# Patient Record
Sex: Female | Born: 1980 | Race: Black or African American | Hispanic: No | Marital: Single | Smoking: Current every day smoker
Health system: Southern US, Community
[De-identification: ages and names within clinical notes are randomized; demographics above are authoritative.]

---

## 2004-08-28 ENCOUNTER — Emergency Department: Payer: Self-pay | Admitting: General Practice

## 2005-08-01 ENCOUNTER — Emergency Department: Payer: Self-pay | Admitting: Emergency Medicine

## 2006-10-08 ENCOUNTER — Observation Stay: Payer: Self-pay | Admitting: Obstetrics and Gynecology

## 2007-01-04 ENCOUNTER — Inpatient Hospital Stay: Payer: Self-pay | Admitting: Obstetrics and Gynecology

## 2009-03-13 ENCOUNTER — Emergency Department: Payer: Self-pay | Admitting: Internal Medicine

## 2009-08-26 ENCOUNTER — Emergency Department: Payer: Self-pay | Admitting: Emergency Medicine

## 2010-07-23 ENCOUNTER — Emergency Department: Payer: Self-pay | Admitting: Emergency Medicine

## 2011-02-26 ENCOUNTER — Inpatient Hospital Stay (INDEPENDENT_AMBULATORY_CARE_PROVIDER_SITE_OTHER)
Admission: RE | Admit: 2011-02-26 | Discharge: 2011-02-26 | Disposition: A | Discharge status: Home / Self Care | Payer: PRIVATE HEALTH INSURANCE | Source: Ambulatory Visit | Attending: Family Medicine | Admitting: Family Medicine

## 2011-02-26 ENCOUNTER — Ambulatory Visit (INDEPENDENT_AMBULATORY_CARE_PROVIDER_SITE_OTHER): Payer: Self-pay

## 2011-02-26 DIAGNOSIS — S9030XA Contusion of unspecified foot, initial encounter: Secondary | ICD-10-CM

## 2011-08-13 ENCOUNTER — Other Ambulatory Visit: Payer: Self-pay | Admitting: Occupational Medicine

## 2011-08-13 ENCOUNTER — Ambulatory Visit: Payer: Self-pay

## 2011-08-13 DIAGNOSIS — R52 Pain, unspecified: Secondary | ICD-10-CM

## 2011-09-29 ENCOUNTER — Emergency Department: Payer: Self-pay | Admitting: Emergency Medicine

## 2013-11-11 ENCOUNTER — Emergency Department: Payer: Self-pay | Admitting: Emergency Medicine

## 2014-02-13 ENCOUNTER — Emergency Department: Payer: Self-pay | Admitting: Emergency Medicine

## 2014-02-13 LAB — URINALYSIS, COMPLETE
Bilirubin,UR: NEGATIVE
Glucose,UR: NEGATIVE mg/dL (ref 0–75)
KETONE: NEGATIVE
Nitrite: POSITIVE
Ph: 5 (ref 4.5–8.0)
SPECIFIC GRAVITY: 1.012 (ref 1.003–1.030)
WBC UR: 512 /HPF (ref 0–5)

## 2014-02-13 LAB — CBC WITH DIFFERENTIAL/PLATELET
Basophil #: 0 10*3/uL (ref 0.0–0.1)
Basophil %: 0.2 %
EOS ABS: 0 10*3/uL (ref 0.0–0.7)
Eosinophil %: 0 %
HCT: 38.8 % (ref 35.0–47.0)
HGB: 12.6 g/dL (ref 12.0–16.0)
LYMPHS PCT: 4.5 %
Lymphocyte #: 0.5 10*3/uL — ABNORMAL LOW (ref 1.0–3.6)
MCH: 30 pg (ref 26.0–34.0)
MCHC: 32.6 g/dL (ref 32.0–36.0)
MCV: 92 fL (ref 80–100)
Monocyte #: 0.9 x10 3/mm (ref 0.2–0.9)
Monocyte %: 7.8 %
NEUTROS ABS: 9.8 10*3/uL — AB (ref 1.4–6.5)
Neutrophil %: 87.5 %
Platelet: 224 10*3/uL (ref 150–440)
RBC: 4.21 10*6/uL (ref 3.80–5.20)
RDW: 12.8 % (ref 11.5–14.5)
WBC: 11.3 10*3/uL — ABNORMAL HIGH (ref 3.6–11.0)

## 2014-02-13 LAB — COMPREHENSIVE METABOLIC PANEL
ANION GAP: 4 — AB (ref 7–16)
Albumin: 3.6 g/dL (ref 3.4–5.0)
Alkaline Phosphatase: 108 U/L
BUN: 10 mg/dL (ref 7–18)
Bilirubin,Total: 0.6 mg/dL (ref 0.2–1.0)
CO2: 28 mmol/L (ref 21–32)
CREATININE: 1.17 mg/dL (ref 0.60–1.30)
Calcium, Total: 9.4 mg/dL (ref 8.5–10.1)
Chloride: 104 mmol/L (ref 98–107)
EGFR (African American): >60
EGFR (Non-African Amer.): >60
GLUCOSE: 112 mg/dL — AB (ref 65–99)
OSMOLALITY: 272 (ref 275–301)
POTASSIUM: 4 mmol/L (ref 3.5–5.1)
SGOT(AST): 195 U/L — ABNORMAL HIGH (ref 15–37)
SGPT (ALT): 142 U/L — ABNORMAL HIGH (ref 12–78)
Sodium: 136 mmol/L (ref 136–145)
Total Protein: 8.1 g/dL (ref 6.4–8.2)

## 2014-02-13 LAB — PREGNANCY, URINE: Pregnancy Test, Urine: NEGATIVE m[IU]/mL

## 2014-02-14 LAB — WET PREP, GENITAL

## 2015-01-24 ENCOUNTER — Emergency Department: Payer: Self-pay | Admitting: Student

## 2016-01-29 ENCOUNTER — Emergency Department: Payer: Managed Care, Other (non HMO)

## 2016-01-29 ENCOUNTER — Emergency Department
Admission: EM | Admit: 2016-01-29 | Discharge: 2016-01-29 | Disposition: A | Discharge status: Home / Self Care | Payer: Managed Care, Other (non HMO) | Attending: Emergency Medicine | Admitting: Emergency Medicine

## 2016-01-29 DIAGNOSIS — Y9289 Other specified places as the place of occurrence of the external cause: Secondary | ICD-10-CM | POA: Diagnosis not present

## 2016-01-29 DIAGNOSIS — Y998 Other external cause status: Secondary | ICD-10-CM | POA: Insufficient documentation

## 2016-01-29 DIAGNOSIS — F17210 Nicotine dependence, cigarettes, uncomplicated: Secondary | ICD-10-CM | POA: Insufficient documentation

## 2016-01-29 DIAGNOSIS — X58XXXA Exposure to other specified factors, initial encounter: Secondary | ICD-10-CM | POA: Insufficient documentation

## 2016-01-29 DIAGNOSIS — Y9389 Activity, other specified: Secondary | ICD-10-CM | POA: Diagnosis not present

## 2016-01-29 DIAGNOSIS — S43004A Unspecified dislocation of right shoulder joint, initial encounter: Secondary | ICD-10-CM | POA: Insufficient documentation

## 2016-01-29 DIAGNOSIS — S0993XA Unspecified injury of face, initial encounter: Secondary | ICD-10-CM | POA: Insufficient documentation

## 2016-01-29 DIAGNOSIS — S4991XA Unspecified injury of right shoulder and upper arm, initial encounter: Secondary | ICD-10-CM | POA: Diagnosis present

## 2016-01-29 DIAGNOSIS — R51 Headache: Secondary | ICD-10-CM | POA: Insufficient documentation

## 2016-01-29 MED ORDER — MORPHINE SULFATE (PF) 2 MG/ML IV SOLN
2.0000 mg | Freq: Once | INTRAVENOUS | Status: AC
Start: 2016-01-29 — End: 2016-01-29
  Administered 2016-01-29: 2 mg via INTRAVENOUS

## 2016-01-29 MED ORDER — ONDANSETRON HCL 4 MG/2ML IJ SOLN
INTRAMUSCULAR | Status: AC
  Filled 2016-01-29: qty 2

## 2016-01-29 MED ORDER — KETAMINE HCL 10 MG/ML IJ SOLN
1.0000 mg/kg | Freq: Once | INTRAMUSCULAR | Status: AC
  Administered 2016-01-29: 80 mg via INTRAVENOUS

## 2016-01-29 MED ORDER — LIDOCAINE HCL (PF) 1 % IJ SOLN
INTRAMUSCULAR | Status: AC
  Filled 2016-01-29: qty 10

## 2016-01-29 MED ORDER — ONDANSETRON HCL 4 MG/2ML IJ SOLN
4.0000 mg | Freq: Once | INTRAMUSCULAR | Status: AC
  Administered 2016-01-29: 4 mg via INTRAVENOUS

## 2016-01-29 MED ORDER — HYDROCODONE-ACETAMINOPHEN 5-325 MG PO TABS: 1.0000 | ORAL_TABLET | ORAL | Status: AC | PRN

## 2016-01-29 MED ORDER — KETAMINE HCL 10 MG/ML IJ SOLN
INTRAMUSCULAR | Status: AC
  Filled 2016-01-29: qty 1

## 2016-01-29 MED ORDER — MORPHINE SULFATE (PF) 2 MG/ML IV SOLN
INTRAVENOUS | Status: AC
  Administered 2016-01-29: 2 mg via INTRAVENOUS
  Filled 2016-01-29: qty 1

## 2016-01-29 MED ORDER — ONDANSETRON HCL 4 MG/2ML IJ SOLN
INTRAMUSCULAR | Status: AC
Start: 2016-01-29 — End: 2016-01-29
  Administered 2016-01-29: 4 mg via INTRAVENOUS
  Filled 2016-01-29: qty 2

## 2016-01-29 NOTE — ED Notes (Signed)
Pt back from CT more alert but remains drowsy. Talking and asking questions  Vitals stable.

## 2016-01-29 NOTE — ED Notes (Signed)
Pt verbalized understanding of discharge instructions. NAD at this time. 
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
## 2016-01-29 NOTE — Sedation Documentation (Signed)
Report to denia, rn. Care transferred to denia, rn.
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
## 2016-01-29 NOTE — ED Notes (Signed)
Pt. Up to bathroom. Continues to try and get someone on the phone to pick her up. Nurse goes over discharge instructions with pt.
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
## 2016-01-29 NOTE — ED Notes (Signed)
Pt remains drowsy. Vitals stable. Easy to arouse. Informed of CT scan.

## 2016-01-29 NOTE — Discharge Instructions (Signed)
Please seek medical attention for any high fevers, chest pain, shortness of breath, change in behavior, persistent vomiting, bloody stool or any other new or concerning symptoms. ° ° °Shoulder Dislocation °Your shoulder joint is made up of 3 bones: °· The upper arm bone (humerus). °· The shoulder blade (scapula). °· The collarbone (clavicle). °A shoulder dislocation happens when your upper arm bone moves out of its normal place in your shoulder joint. °HOME CARE °If You Have a Splint or Sling: °· Wear it as told by your doctor. °· Take it off only as told by your doctor. °· Loosen it if: °¨ Your fingers become numb and tingly. °¨ Your fingers turn cold and blue. °· Keep it clean and dry. °Bathing °· Do not take baths, swim, or use a hot tub until your doctor says you can. Ask your doctor if you can take showers. You may only be allowed to take sponge baths. °· If your doctor says taking baths or showers is okay, cover your splint or sling with a plastic bag. Do not let the splint or sling get wet. °Managing Pain, Stiffness, and Swelling °· If told, put ice on the injured area. °¨ Put ice in a plastic bag. °¨ Place a towel between your skin and the bag. °¨ Leave the ice on for 20 minutes, 2-3 times per day. °· Move your fingers often to avoid stiffness and to lessen swelling. °· Raise (elevate) the injured area above the level of your heart while you are sitting or lying down. °Driving °· Do not drive while you are wearing a splint or sling on a hand that you use for driving. °· Do not drive or operate heavy machinery while taking pain medicine. °Activity °· Return to your normal activities as told by your doctor. Ask your doctor what activities are safe for you. °· Do range-of-motion exercises only as told by your doctor. °· Exercise your hand by squeezing a soft ball. This keeps your hand and wrist from getting stiff and swollen. °General Instructions °· Take over-the-counter and prescription medicines only as told  by your doctor. °· Do not use any tobacco products, including cigarettes, chewing tobacco, or e-cigarettes. Tobacco can slow down healing. If you need help quitting, ask your doctor. °· Keep all follow-up visits as told by your doctor. This is important. °GET HELP IF: °· Your splint or sling gets damaged. °GET HELP RIGHT AWAY IF: °· Your pain gets worse instead of better. °· You lose feeling in your arm or hand. °· Your arm or hand turns white and cold. °  °This information is not intended to replace advice given to you by your health care provider. Make sure you discuss any questions you have with your health care provider. °  °Document Released: 01/28/2012 Document Revised: 07/27/2015 Document Reviewed: 02/28/2015 °Elsevier Interactive Patient Education ©2016 [employer] ° °

## 2016-02-02 ENCOUNTER — Other Ambulatory Visit: Payer: Self-pay | Admitting: Orthopedic Surgery

## 2016-02-02 DIAGNOSIS — S42251A Displaced fracture of greater tuberosity of right humerus, initial encounter for closed fracture: Secondary | ICD-10-CM

## 2016-02-06 ENCOUNTER — Ambulatory Visit
Admission: RE | Admit: 2016-02-06 | Discharge: 2016-02-06 | Disposition: A | Discharge status: Home / Self Care | Payer: Managed Care, Other (non HMO) | Source: Ambulatory Visit | Attending: Orthopedic Surgery | Admitting: Orthopedic Surgery

## 2016-02-06 DIAGNOSIS — W1830XA Fall on same level, unspecified, initial encounter: Secondary | ICD-10-CM | POA: Diagnosis not present

## 2016-02-06 DIAGNOSIS — S42251A Displaced fracture of greater tuberosity of right humerus, initial encounter for closed fracture: Secondary | ICD-10-CM | POA: Diagnosis not present

## 2016-08-31 ENCOUNTER — Emergency Department: Payer: No Typology Code available for payment source

## 2016-08-31 ENCOUNTER — Emergency Department
Admission: EM | Admit: 2016-08-31 | Discharge: 2016-08-31 | Disposition: A | Discharge status: Home / Self Care | Payer: No Typology Code available for payment source | Attending: Emergency Medicine | Admitting: Emergency Medicine

## 2016-08-31 DIAGNOSIS — Y9389 Activity, other specified: Secondary | ICD-10-CM | POA: Diagnosis not present

## 2016-08-31 DIAGNOSIS — Y999 Unspecified external cause status: Secondary | ICD-10-CM | POA: Diagnosis not present

## 2016-08-31 DIAGNOSIS — Y92410 Unspecified street and highway as the place of occurrence of the external cause: Secondary | ICD-10-CM | POA: Diagnosis not present

## 2016-08-31 DIAGNOSIS — S161XXA Strain of muscle, fascia and tendon at neck level, initial encounter: Secondary | ICD-10-CM | POA: Diagnosis not present

## 2016-08-31 DIAGNOSIS — S39012A Strain of muscle, fascia and tendon of lower back, initial encounter: Secondary | ICD-10-CM

## 2016-08-31 DIAGNOSIS — F17210 Nicotine dependence, cigarettes, uncomplicated: Secondary | ICD-10-CM | POA: Insufficient documentation

## 2016-08-31 DIAGNOSIS — M542 Cervicalgia: Secondary | ICD-10-CM | POA: Diagnosis present

## 2016-08-31 MED ORDER — CYCLOBENZAPRINE HCL 5 MG PO TABS: 5.0000 mg | ORAL_TABLET | Freq: Three times a day (TID) | ORAL | 0 refills | Status: AC | PRN

## 2016-08-31 MED ORDER — ACETAMINOPHEN 500 MG PO TABS
1000.0000 mg | ORAL_TABLET | Freq: Once | ORAL | Status: AC
  Administered 2016-08-31: 1000 mg via ORAL
  Filled 2016-08-31: qty 2

## 2016-08-31 MED ORDER — IBUPROFEN 600 MG PO TABS: 600.0000 mg | ORAL_TABLET | Freq: Four times a day (QID) | ORAL | 0 refills | Status: AC | PRN

## 2016-08-31 MED ORDER — CYCLOBENZAPRINE HCL 10 MG PO TABS
10.0000 mg | ORAL_TABLET | Freq: Once | ORAL | Status: AC
  Administered 2016-08-31: 10 mg via ORAL
  Filled 2016-08-31: qty 1

## 2016-08-31 MED ORDER — TRAMADOL HCL 50 MG PO TABS: 50.0000 mg | ORAL_TABLET | Freq: Four times a day (QID) | ORAL | 0 refills | Status: AC | PRN

## 2016-08-31 MED ORDER — TRAMADOL HCL 50 MG PO TABS
50.0000 mg | ORAL_TABLET | Freq: Once | ORAL | Status: AC
  Administered 2016-08-31: 50 mg via ORAL
  Filled 2016-08-31: qty 1
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
## 2016-08-31 NOTE — Discharge Instructions (Signed)
Please follow-up with your primary care physician if no improvement in 5-7 days. Take medications as prescribed. Return to the ER for any worsening symptoms urgent changes in her health.
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
## 2016-09-03 ENCOUNTER — Encounter: Payer: Self-pay | Admitting: Emergency Medicine

## 2016-09-03 ENCOUNTER — Emergency Department
Admission: EM | Admit: 2016-09-03 | Discharge: 2016-09-03 | Disposition: A | Discharge status: Home / Self Care | Payer: No Typology Code available for payment source | Attending: Student in an Organized Health Care Education/Training Program | Admitting: Student in an Organized Health Care Education/Training Program

## 2016-09-03 DIAGNOSIS — Y92410 Unspecified street and highway as the place of occurrence of the external cause: Secondary | ICD-10-CM | POA: Insufficient documentation

## 2016-09-03 DIAGNOSIS — Z79899 Other long term (current) drug therapy: Secondary | ICD-10-CM | POA: Diagnosis not present

## 2016-09-03 DIAGNOSIS — Z791 Long term (current) use of non-steroidal anti-inflammatories (NSAID): Secondary | ICD-10-CM | POA: Insufficient documentation

## 2016-09-03 DIAGNOSIS — Y999 Unspecified external cause status: Secondary | ICD-10-CM | POA: Diagnosis not present

## 2016-09-03 DIAGNOSIS — S161XXD Strain of muscle, fascia and tendon at neck level, subsequent encounter: Secondary | ICD-10-CM

## 2016-09-03 DIAGNOSIS — F17210 Nicotine dependence, cigarettes, uncomplicated: Secondary | ICD-10-CM | POA: Diagnosis not present

## 2016-09-03 DIAGNOSIS — Y9389 Activity, other specified: Secondary | ICD-10-CM | POA: Insufficient documentation

## 2016-09-03 DIAGNOSIS — S161XXA Strain of muscle, fascia and tendon at neck level, initial encounter: Secondary | ICD-10-CM | POA: Insufficient documentation

## 2016-09-03 DIAGNOSIS — S199XXA Unspecified injury of neck, initial encounter: Secondary | ICD-10-CM | POA: Diagnosis present
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
## 2016-09-03 NOTE — Discharge Instructions (Signed)
You exam shows improvement. Continue to dose the prescription meds as directed. Apply ice compresses and moist heat the the sore muscles as needed. [facility] as needed for continued symptoms.
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
## 2018-12-26 ENCOUNTER — Encounter: Payer: Self-pay | Admitting: Emergency Medicine

## 2018-12-26 ENCOUNTER — Other Ambulatory Visit: Payer: Self-pay

## 2018-12-26 ENCOUNTER — Emergency Department
Admission: EM | Admit: 2018-12-26 | Discharge: 2018-12-26 | Disposition: A | Discharge status: Home / Self Care | Payer: PRIVATE HEALTH INSURANCE | Attending: Emergency Medicine | Admitting: Emergency Medicine

## 2018-12-26 DIAGNOSIS — J101 Influenza due to other identified influenza virus with other respiratory manifestations: Secondary | ICD-10-CM | POA: Diagnosis not present

## 2018-12-26 DIAGNOSIS — F17210 Nicotine dependence, cigarettes, uncomplicated: Secondary | ICD-10-CM | POA: Insufficient documentation

## 2018-12-26 DIAGNOSIS — R05 Cough: Secondary | ICD-10-CM | POA: Diagnosis present

## 2018-12-26 LAB — INFLUENZA PANEL BY PCR (TYPE A & B)
Influenza A By PCR: POSITIVE — AB
Influenza B By PCR: NEGATIVE

## 2018-12-26 MED ORDER — PROMETHAZINE-DM 6.25-15 MG/5ML PO SYRP: 5.0000 mL | ORAL_SOLUTION | Freq: Four times a day (QID) | ORAL | 0 refills | Status: AC | PRN

## 2018-12-26 MED ORDER — IBUPROFEN 600 MG PO TABS: 600.0000 mg | ORAL_TABLET | Freq: Three times a day (TID) | ORAL | 0 refills | Status: AC | PRN

## 2018-12-26 MED ORDER — OSELTAMIVIR PHOSPHATE 75 MG PO CAPS: 75.0000 mg | ORAL_CAPSULE | Freq: Two times a day (BID) | ORAL | 0 refills | Status: AC

## 2018-12-26 NOTE — ED Provider Notes (Signed)
[facility] Emergency Department Provider Note   ____________________________________________   First MD Initiated Contact with Patient 12/26/18 [phone]     (approximate)  I have reviewed the triage vital signs and the nursing notes.   HISTORY  Chief Complaint Cough    HPI Kathryn Melton is a 38 y.o. female patient presents with 2 days of body aches, sinus congestion, nonproductive cough, and chills.  Patient denies nausea, vomiting, diarrhea.  Patient not taken flu shot for this season.  Patient rates the pain as a 5/10.  Patient described the pain is "achy".  No palliative measure for complaint.   Had a rapid flu test in triage.   History reviewed. No pertinent past medical history.  There are no active problems to display for this patient.   History reviewed. No pertinent surgical history.  Prior to Admission medications   Medication Sig Start Date End Date Taking? Authorizing Provider  cyclobenzaprine (FLEXERIL) 5 MG tablet Take 1-2 tablets (5-10 mg total) by mouth 3 (three) times daily as needed for muscle spasms. 08/31/16   Evon Slack, PA-C  HYDROcodone-acetaminophen (NORCO/VICODIN) 5-325 MG tablet Take 1 tablet by mouth every 4 (four) hours as needed for moderate pain. 01/29/16   Phineas Semen, MD  ibuprofen (ADVIL,MOTRIN) 600 MG tablet Take 1 tablet (600 mg total) by mouth every 6 (six) hours as needed for moderate pain. 08/31/16   Evon Slack, PA-C  ibuprofen (ADVIL,MOTRIN) 600 MG tablet Take 1 tablet (600 mg total) by mouth every 8 (eight) hours as needed. 12/26/18   Joni Reining, PA-C  oseltamivir (TAMIFLU) 75 MG capsule Take 1 capsule (75 mg total) by mouth 2 (two) times daily for 5 days. 12/26/18 12/31/18  Joni Reining, PA-C  promethazine-dextromethorphan (PROMETHAZINE-DM) 6.25-15 MG/5ML syrup Take 5 mLs by mouth 4 (four) times daily as needed for cough. 12/26/18   Joni Reining, PA-C  traMADol (ULTRAM) 50 MG tablet Take 1 tablet  (50 mg total) by mouth every 6 (six) hours as needed. 08/31/16   Evon Slack, PA-C    Allergies Patient has no known allergies.  No family history on file.  Social History Social History   Tobacco Use  . Smoking status: Current Every Day Smoker    Packs/day: 1.00    Types: Cigarettes  . Smokeless tobacco: Never Used  Substance Use Topics  . Alcohol use: Yes    Alcohol/week: 2.0 standard drinks    Types: 2 Cans of beer per week    Comment: Monthly  . Drug use: No    Review of Systems Constitutional: Fever/chills and body aches. Eyes: No visual changes. ENT: No sore throat.  Nasal congestion. Cardiovascular: Denies chest pain. Respiratory: Denies shortness of breath.  Nonproductive cough. Gastrointestinal: No abdominal pain.  No nausea, no vomiting.  No diarrhea.  No constipation. Genitourinary: Negative for dysuria. Musculoskeletal: Negative for back pain. Skin: Negative for rash. Neurological: Negative for headaches, focal weakness or numbness.   ____________________________________________   PHYSICAL EXAM:  VITAL SIGNS: ED Triage Vitals  Enc Vitals Group     BP 12/26/18 0538 110/69     Pulse Rate 12/26/18 0538 (!) 114     Resp 12/26/18 0538 18     Temp 12/26/18 0538 (!) 100.7 F (38.2 C)     Temp Source 12/26/18 0538 Oral     SpO2 12/26/18 0538 100 %     Weight 12/26/18 0524 211 lb (95.7 kg)     Height 12/26/18  0524 5\' 3"  (1.6 m)     Head Circumference --      Peak Flow --      Pain Score 12/26/18 0524 5     Pain Loc --      Pain Edu? --      Excl. in GC? --     Constitutional: Alert and oriented. Well appearing and in no acute distress. Nose: Clear rhinorrhea.   Mouth/Throat: Mucous membranes are moist.  Oropharynx non-erythematous. Neck: No stridor.   Hematological/Lymphatic/Immunilogical: No cervical lymphadenopathy. Cardiovascular: Normal rate, regular rhythm. Grossly normal heart sounds.  Good peripheral circulation. Respiratory: Normal  respiratory effort.  No retractions. Lungs CTAB. Musculoskeletal: No lower extremity tenderness nor edema.  No joint effusions. Neurologic:  Normal speech and language. No gross focal neurologic deficits are appreciated. No gait instability. Skin:  Skin is warm, dry and intact. No rash noted. Psychiatric: Mood and affect are normal. Speech and behavior are normal.  ____________________________________________   LABS (all labs ordered are listed, but only abnormal results are displayed)  Labs Reviewed  INFLUENZA PANEL BY PCR (TYPE A & B) - Abnormal; Notable for the following components:      Result Value   Influenza A By PCR POSITIVE (*)    All other components within normal limits   ____________________________________________  EKG   ____________________________________________  RADIOLOGY  ED MD interpretation:    Official radiology report(s): No results found.  ____________________________________________   PROCEDURES  Procedure(s) performed: None  Procedures  Critical Care performed: No  ____________________________________________   INITIAL IMPRESSION / ASSESSMENT AND PLAN / ED COURSE  As part of my medical decision making, I reviewed the following data within the electronic MEDICAL RECORD NUMBER     Patient presents with viral symptoms for 2 days.  Patient test positive for influenza A.  Patient given discharge care instruction work note.  Patient vies take medication as directed and follow-up with PCP.      ____________________________________________   FINAL CLINICAL IMPRESSION(S) / ED DIAGNOSES  Final diagnoses:  Influenza A     ED Discharge Orders         Ordered    oseltamivir (TAMIFLU) 75 MG capsule  2 times daily     12/26/18 0805    promethazine-dextromethorphan (PROMETHAZINE-DM) 6.25-15 MG/5ML syrup  4 times daily PRN     12/26/18 0805    ibuprofen (ADVIL,MOTRIN) 600 MG tablet  Every 8 hours PRN     12/26/18 0805           Note:   This document was prepared using Dragon voice recognition software and may include unintentional dictation errors.    Joni ReiningSmith, Jaliel Deavers K, PA-C 12/26/18 0810    Jene EveryKinner, Robert, MD 12/26/18 [phone]

## 2018-12-26 NOTE — ED Notes (Signed)
See triage note  Presents with body aches,cough and low grade fever

## 2018-12-26 NOTE — ED Triage Notes (Signed)
Patient ambulatory to triage with steady gait, without difficulty or distress noted, pt reports x 2 days having nonprod cough, sinus drainage, body aches, chills

## 2019-09-03 ENCOUNTER — Other Ambulatory Visit: Payer: Self-pay

## 2019-09-03 DIAGNOSIS — Z20822 Contact with and (suspected) exposure to covid-19: Secondary | ICD-10-CM

## 2019-09-04 LAB — NOVEL CORONAVIRUS, NAA: SARS-CoV-2, NAA: DETECTED — AB

## 2023-04-10 ENCOUNTER — Ambulatory Visit (LOCAL_COMMUNITY_HEALTH_CENTER): Payer: Managed Care, Other (non HMO) | Admitting: Advanced Practice Midwife

## 2023-04-10 VITALS — BP 124/79 | HR 99 | Ht 63.0 in | Wt 228.4 lb

## 2023-04-10 DIAGNOSIS — Z3009 Encounter for other general counseling and advice on contraception: Secondary | ICD-10-CM

## 2023-04-10 DIAGNOSIS — Z30013 Encounter for initial prescription of injectable contraceptive: Secondary | ICD-10-CM

## 2023-04-10 DIAGNOSIS — E669 Obesity, unspecified: Secondary | ICD-10-CM

## 2023-04-10 DIAGNOSIS — F17200 Nicotine dependence, unspecified, uncomplicated: Secondary | ICD-10-CM

## 2023-04-10 DIAGNOSIS — Z3202 Encounter for pregnancy test, result negative: Secondary | ICD-10-CM

## 2023-04-10 DIAGNOSIS — Z309 Encounter for contraceptive management, unspecified: Secondary | ICD-10-CM | POA: Diagnosis not present

## 2023-04-10 DIAGNOSIS — D259 Leiomyoma of uterus, unspecified: Secondary | ICD-10-CM

## 2023-04-10 LAB — WET PREP FOR TRICH, YEAST, CLUE
Trichomonas Exam: NEGATIVE
Yeast Exam: NEGATIVE

## 2023-04-10 LAB — PREGNANCY, URINE: Preg Test, Ur: NEGATIVE

## 2023-04-10 MED ORDER — MEDROXYPROGESTERONE ACETATE 150 MG/ML IM SUSP
150.0000 mg | INTRAMUSCULAR | Status: DC
  Administered 2023-04-10 – 2023-09-25 (×3): 150 mg via INTRAMUSCULAR

## 2023-04-10 NOTE — Progress Notes (Signed)
Pt appointment for PE, pregnancy test, and depo. Seen by Lesia Sago. Family planning packet given and contents reviewed. Wet prep results all negative and reviewed with pt. Depo given as ordered. Referral for Fibroid management faxed to [facility].

## 2023-04-10 NOTE — Progress Notes (Signed)
[facility] [address] Main Number: [phone]    Family Planning Visit- Initial Visit  Subjective:  Kathryn Melton is a 42 y.o.  MBF smoker G2P2002  (23, 16) being seen today for an initial annual visit and to discuss reproductive life planning.  The patient is currently using No Method - Other Reason for pregnancy prevention. Patient reports   does not want a pregnancy in the next year.     report they are looking for a method that provides High efficacy at preventing pregnancy  Patient has the following medical conditions has Obesity BMI=40.4; Uterine fibroids; and Smoker 1/2-1 ppd on their problem list.  Chief Complaint  Patient presents with   Contraception    PE and Depo    Patient reports here for physical, pap, DMPA initiation. LMP 03/09/23. Last sex last night without condom; with current partner x 8 years; 1 partner in last 3 mo. Smoking 1/2-1 ppd. Last MJ 5 years ago. Last ETOH yesterday (4 shots+4 beers) 1x/mo. Last dental exam 2023. Working >40 hrs/wk and living with her husband and 2 kids. Declines Plan B. Wants DMPA today. Last PE maybe 2023 at [facility] but not visible in Epic. Unknown last pap.  Patient denies vaping, cigars   Body mass index is 40.46 kg/m. - Patient is eligible for diabetes screening based on BMI> 25 and age >35?  yes HA1C ordered? Pt declines  Patient reports 1  partner/s in last year. Desires STI screening?  No - pt declines bloodwork  Has patient been screened once for HCV in the past?  No  No results found for: "HCVAB"  Does the patient have current drug use (including MJ), have a partner with drug use, and/or has been incarcerated since last result? No  If yes-- Screen for HCV through [facility]   Does the patient meet criteria for HBV testing? Yes  Criteria:  -Household, sexual or needle sharing contact with HBV -History of drug use -HIV positive -Those with known Hep  C   Health Maintenance Due  Topic Date Due   COVID-19 Vaccine (1) Never done   HIV Screening  Never done   Hepatitis C Screening  Never done   DTaP/Tdap/Td (1 - Tdap) Never done   PAP SMEAR-Modifier  Never done    Review of Systems  All other systems reviewed and are negative.   The following portions of the patient's history were reviewed and updated as appropriate: allergies, current medications, past family history, past medical history, past social history, past surgical history and problem list. Problem list updated.   See flowsheet for other program required questions.  Objective:   Vitals:   04/10/23 1411  BP: 124/79  Pulse: 99  Weight: 228 lb 6.4 oz (103.6 kg)  Height: 5\' 3"  (1.6 m)    Physical Exam Constitutional:      Appearance: Normal appearance. Kathryn Melton is obese.  HENT:     Head: Normocephalic and atraumatic.     Mouth/Throat:     Mouth: Mucous membranes are moist.     Comments: Last dental exam 2023 Eyes:     Conjunctiva/sclera: Conjunctivae normal.  Neck:     Thyroid: No thyroid mass, thyromegaly or thyroid tenderness.  Cardiovascular:     Rate and Rhythm: Normal rate and regular rhythm.  Pulmonary:     Effort: Pulmonary effort is normal.     Breath sounds: Normal breath sounds.  Chest:  Breasts:  Right: Normal.     Left: Normal.  Abdominal:     Palpations: Abdomen is soft.     Comments: Soft without tenderness; uterus enlarged 18 wks size; counseled needs primary care MD to refer for u/s; pt states menses heavy but no other concerns  Genitourinary:    General: Normal vulva.     Exam position: Lithotomy position.     Vagina: Vaginal discharge (white creamy leukorrhea, ph<4.5) present.     Cervix: Normal.     Uterus: Enlarged (enlarged 18 wks size; probable fibroids).      Rectum: Normal.     Comments: Pap done Difficult to assess adnexa due to increased adipose Musculoskeletal:        General: Normal range of motion.     Cervical back:  Normal range of motion and neck supple.  Skin:    General: Skin is warm and dry.  Neurological:     Mental Status: Kathryn Melton is alert.  Psychiatric:        Mood and Affect: Mood normal.      Assessment and Plan:  Anjanette L Dismuke is a 42 y.o. female presenting to the [facility] Department for an initial annual wellness/contraceptive visit  Contraception counseling: Reviewed options based on patient desire and reproductive life plan. Patient is interested in Hormonal Injection. This was provided to the patient today.  if not why not clearly documented  Risks, benefits, and typical effectiveness rates were reviewed.  Questions were answered.  Written information was also given to the patient to review.    The patient will follow up in  11-13 weeks for surveillance.  The patient was told to call with any further questions, or with any concerns about this method of contraception.  Emphasized use of condoms 100% of the time for STI prevention.  Need for ECP was assessed. Patient reported Unprotected sex within past 72 hours.  Reviewed options and patient desired No method of ECP, declined all    1. Family planning Treat wet mount per standing orders Immunization nurse consult Pt requesting breast reduction surgery--counseled needs primary care MD to make referral for surgeon Please give pt primary care MD list  - Pregnancy, urine - WET PREP FOR TRICH, YEAST, CLUE - Chlamydia/Gonorrhea [facility] - IGP, Aptima HPV  2. Encounter for initial prescription of injectable contraceptive Pt declines Plan B and desires DMPA 150 mg IM q 11-13 wks x 1 year if PT neg today Pt counseled abstinance next 7 days Pt counseled to do PT 04/23/23 and call us if + - medroxyPROGESTERone (DEPO-PROVERA) injection 150 mg  3. Obesity, unspecified classification, unspecified obesity type, unspecified whether serious comorbidity present   4. Uterine fibroids Referral sent for [facility]  for uterine u/s  5. Smoker 1/2-1 ppd Counseled via 5 A's to stop smoking     No follow-ups on file.  No future appointments.  Alberteen Spindle, CNM

## 2023-04-16 ENCOUNTER — Encounter: Payer: Self-pay | Admitting: Advanced Practice Midwife

## 2023-04-16 DIAGNOSIS — R87619 Unspecified abnormal cytological findings in specimens from cervix uteri: Secondary | ICD-10-CM | POA: Insufficient documentation

## 2023-04-16 LAB — IGP, APTIMA HPV
HPV Aptima: POSITIVE — AB
PAP Smear Comment: 0

## 2023-04-17 ENCOUNTER — Telehealth: Payer: Self-pay

## 2023-04-17 NOTE — Telephone Encounter (Signed)
Call to pt re pap smear result from 04/10/23 specimen. NIL Positive HPV  Confirm insurance and determine where pt wants to go for colpo.

## 2023-04-22 NOTE — Telephone Encounter (Signed)
04/22/23- Colpo referral sent to  OBGYN through Epic's Referrals tab.

## 2023-04-22 NOTE — Telephone Encounter (Signed)
Phone call to pt and pt confirmed identity. Discussed pap smear results and colpo referral.   Pt states she has insurance through employer, does not have her card with her right now but will look for it to have it available when colpo provider calls her. Pt states [facility] will be a good location (used to be [facility]).

## 2023-05-09 ENCOUNTER — Telehealth: Payer: Self-pay | Admitting: Family Medicine

## 2023-05-09 NOTE — Telephone Encounter (Signed)
Pt states that she has been on her periods x 9 days.  She alternates between a heavy and light flow.  Pt informed that she may have irregular periods for up to 6 months due to the new BCM. Advised pt to try Ibuprofen 800 mg every 8 hours x 5 days.  Informed her to call if symptoms get worse.  Berdie Ogren, RN

## 2023-05-09 NOTE — Telephone Encounter (Signed)
Pt calls & states she has been bleeding x 1 month. Please advise.

## 2023-05-29 ENCOUNTER — Ambulatory Visit: Payer: Managed Care, Other (non HMO) | Admitting: Obstetrics and Gynecology

## 2023-05-29 ENCOUNTER — Telehealth: Payer: Self-pay | Admitting: Obstetrics and Gynecology

## 2023-05-29 DIAGNOSIS — B977 Papillomavirus as the cause of diseases classified elsewhere: Secondary | ICD-10-CM

## 2023-05-29 DIAGNOSIS — Z7689 Persons encountering health services in other specified circumstances: Secondary | ICD-10-CM

## 2023-05-29 NOTE — Telephone Encounter (Signed)
Reached out to pt to reschedule colpo appt that was scheduled on 05/29/2023 at 2:15 with Dr. Logan Bores.  Left message for pt to call back.

## 2023-05-30 ENCOUNTER — Encounter: Payer: Self-pay | Admitting: Obstetrics and Gynecology

## 2023-05-30 NOTE — Telephone Encounter (Signed)
Reached out to pt (2x)  to reschedule colpo appt that was schedule on 05/29/2023 at 2:15 with Dr. Logan Bores.  Left message for pt to call back.  Will send a MyChart letter.

## 2023-06-03 ENCOUNTER — Encounter: Payer: Self-pay | Admitting: Obstetrics and Gynecology

## 2023-06-19 ENCOUNTER — Telehealth: Payer: Self-pay

## 2023-06-19 NOTE — Telephone Encounter (Signed)
Rn called to check on status of pt's referral. Was informed that pt has been scheduled to be seen tomorrow 06/20/23 @1345  by MD Chana Bode. Will continue to follow up.

## 2023-07-03 ENCOUNTER — Ambulatory Visit: Payer: Managed Care, Other (non HMO)

## 2023-07-03 DIAGNOSIS — Z30013 Encounter for initial prescription of injectable contraceptive: Secondary | ICD-10-CM

## 2023-07-03 DIAGNOSIS — Z3042 Encounter for surveillance of injectable contraceptive: Secondary | ICD-10-CM

## 2023-07-03 DIAGNOSIS — Z3009 Encounter for other general counseling and advice on contraception: Secondary | ICD-10-CM

## 2023-07-03 DIAGNOSIS — Z309 Encounter for contraceptive management, unspecified: Secondary | ICD-10-CM

## 2023-07-03 NOTE — Progress Notes (Signed)
12w 0d post depo. Voices no concerns. Depo given today per order by Hazle Coca, CNM dated 04/10/2023. Tolerated well in LUOQ. Next depo due 09/18/2023; patient aware.   Abagail Kitchens, RN

## 2023-07-25 NOTE — Progress Notes (Signed)
Patient has missed appointment with [facility] and with [facility] in April and in July 2024. [facility] has called and sent letters to patient. Appt has not been rescheduled.   Certified letter mailed to pt on 07/25/23 addressing the missed appointments, her responsibility, and contact numbers to reschedule. See scanned copy.   (04/10/23 last pap)

## 2023-09-25 ENCOUNTER — Ambulatory Visit: Payer: Managed Care, Other (non HMO)

## 2023-09-25 VITALS — BP 122/74 | Ht 63.0 in | Wt 216.5 lb

## 2023-09-25 DIAGNOSIS — Z30013 Encounter for initial prescription of injectable contraceptive: Secondary | ICD-10-CM

## 2023-09-25 DIAGNOSIS — Z3009 Encounter for other general counseling and advice on contraception: Secondary | ICD-10-CM | POA: Diagnosis not present

## 2023-09-25 DIAGNOSIS — Z3042 Encounter for surveillance of injectable contraceptive: Secondary | ICD-10-CM

## 2023-09-25 NOTE — Progress Notes (Signed)
12 weeks 0 days post depo. Depo given today per order by Hazle Coca, CNM dated 04/10/2023. Tolerated well L UOQ. Next depo due 12/11/2023, has reminder. Jerel Shepherd, RN

## 2024-01-15 ENCOUNTER — Ambulatory Visit: Payer: Managed Care, Other (non HMO) | Admitting: Nurse Practitioner

## 2024-01-15 VITALS — Ht 63.0 in | Wt 218.4 lb

## 2024-01-15 DIAGNOSIS — Z3009 Encounter for other general counseling and advice on contraception: Secondary | ICD-10-CM | POA: Diagnosis not present

## 2024-01-15 MED ORDER — NORETHINDRONE 0.35 MG PO TABS: 1.0000 | ORAL_TABLET | Freq: Every day | ORAL | 13 refills | Status: AC

## 2024-01-15 NOTE — Progress Notes (Signed)
 Pt is here asking to change her BCM from Depo to OCP's.  Berdie Ogren, RN

## 2024-01-22 ENCOUNTER — Encounter: Payer: Self-pay | Admitting: Nurse Practitioner

## 2024-01-22 NOTE — Progress Notes (Signed)
 [employer] HE[facility] DEPARTMENT [facility] [address], [address] [place] [address] Main phone: [phone]  Family Planning Visit - Repeat Yearly Visit  Subjective:  Kathryn Melton is a 43 y.o. G2P2002  being seen today to discuss contraception options. The patient is currently using hormonal injection for pregnancy prevention. Patient does not want a pregnancy in the next year.   Patient reports they are looking for a method with the following characteristics:  High efficacy at preventing pregnancy Does not involve insertion  Method they can control starting and stopping  Patient has the following medical problems:  Patient Active Problem List   Diagnosis Date Noted   Abnormal Pap smear of cervix 04/10/23 neg HPV+ 04/16/2023   Obesity BMI=40.4 04/10/2023   Uterine fibroids 04/10/2023   Smoker 1/2-1 ppd 04/10/2023    Chief Complaint  Patient presents with   Contraception    Change BCM    HPI Patient is a pleasant 43 y.o. female who presents to the office today to change her contraceptive method from hormonal injection to oral contraceptive pills. Patient indicates female partners. She reports no condom use. Patient reports last sex was about 2 weeks ago.  Patient indicates LMP was 11/25/23 and periods are infrequent and irregular since being on hormonal injection.   Patient reports a negative UPT at home yesterday.   See flowsheet for other program required questions.   STI screening Does this patient desire STI screening?  No - patient only wishes to discuss change in contraception today.    Cervical Cancer Screening  Result Date Procedure Results Follow-ups  04/10/2023 IGP, Aptima HPV DIAGNOSIS:: Comment Specimen adequacy:: Comment Clinician Provided ICD10: Comment Performed by:: Comment PAP Smear Comment: . Note:: Comment Test Methodology: Comment HPV Aptima: Positive (A)     Health Maintenance Due  Topic Date Due   Pneumococcal  Vaccine 73-19 Years old (1 of 2 - PCV) Never done   HIV Screening  Never done   Hepatitis C Screening  Never done   DTaP/Tdap/Td (1 - Tdap) Never done   INFLUENZA VACCINE  Never done   COVID-19 Vaccine (1 - 2024-25 season) Never done    The following portions of the patient's history were reviewed and updated as appropriate: allergies, current medications, past family history, past medical history, past social history, past surgical history and problem list. Problem list updated.  Objective:   Vitals:   01/15/24 1404  Weight: 218 lb 6.4 oz (99.1 kg)  Height: 5\' 3"  (1.6 m)    Physical Exam Nursing note reviewed.  Constitutional:      Appearance: Normal appearance.  HENT:     Head: Normocephalic.     Mouth/Throat:     Lips: Pink. No lesions.  Eyes:     General:        Right eye: No discharge.        Left eye: No discharge.     Conjunctiva/sclera:     Right eye: Right conjunctiva is not injected.     Left eye: Left conjunctiva is not injected.  Pulmonary:     Effort: Pulmonary effort is normal.  Genitourinary:    Comments: Patient asymptomatic. Declines genital exam. Declines STI testing.  Neurological:     Mental Status: She is alert and oriented to person, place, and time.  Psychiatric:        Attention and Perception: Attention and perception normal.        Mood and Affect: Mood and affect normal.  Speech: Speech normal.        Behavior: Behavior normal. Behavior is cooperative.        Thought Content: Thought content normal.     Assessment and Plan:  Kathryn Melton is a 43 y.o. female G2P2002 presenting to the [facility] Department for a contraception visit.  1. Family planning (Primary) Contraception counseling: Reviewed options based on patient desire and reproductive life plan. Patient is interested in Oral Contraceptive. This was provided to the patient today.   Risks, benefits, and typical effectiveness rates were reviewed.  Questions were  answered.  Written information was also given to the patient to review.    The patient will follow up in  3 months for surveillance.  The patient was told to call with any further questions, or with any concerns about this method of contraception.  Emphasized use of condoms 100% of the time for STI prevention.  Educated on ECP and assessed need for ECP. Patient does not qualify for ECP based on no unprotected sex.   Patient does not qualify for estrogen containing contraceptive methods due to age (>35) and she smokes tobacco containing products putting her at higher risk for clots. With shared-decision making after providing education on POPs, patient opted for progestin only pills.  - norethindrone (ORTHO MICRONOR) 0.35 MG tablet; Take 1 tablet (0.35 mg total) by mouth daily.  Dispense: 28 tablet; Refill: 13  Return in about 3 months (around 04/13/2024) for PAP with HPV testing. Per ACOG guidelines 1 year repeat PAP is indicated based on NILM, HPV (+).. Patient should also have annual PE at this visit. Patient made aware.   No future appointments.  Total time with patient 20 minutes.   Edmonia James, NP

## 2024-07-06 ENCOUNTER — Other Ambulatory Visit: Payer: Self-pay | Admitting: Family Medicine

## 2024-07-06 DIAGNOSIS — Z1231 Encounter for screening mammogram for malignant neoplasm of breast: Secondary | ICD-10-CM

## 2024-07-24 ENCOUNTER — Ambulatory Visit
Admission: RE | Admit: 2024-07-24 | Discharge: 2024-07-24 | Disposition: A | Discharge status: Home / Self Care | Source: Ambulatory Visit | Attending: Family Medicine | Admitting: Family Medicine

## 2024-07-24 DIAGNOSIS — Z1231 Encounter for screening mammogram for malignant neoplasm of breast: Secondary | ICD-10-CM | POA: Insufficient documentation
